# Patient Record
Sex: Male | Born: 1970 | Race: White | Hispanic: Yes | Marital: Married | Smoking: Never smoker
Health system: Southern US, Community
[De-identification: ages and names within clinical notes are randomized; demographics above are authoritative.]

---

## 2004-05-07 ENCOUNTER — Emergency Department (HOSPITAL_COMMUNITY): Admission: EM | Admit: 2004-05-07 | Discharge: 2004-05-07 | Payer: Self-pay | Admitting: Emergency Medicine

## 2004-10-24 ENCOUNTER — Emergency Department (HOSPITAL_COMMUNITY): Admission: EM | Admit: 2004-10-24 | Discharge: 2004-10-24 | Payer: Self-pay | Admitting: Emergency Medicine

## 2006-02-18 ENCOUNTER — Emergency Department (HOSPITAL_COMMUNITY): Admission: EM | Admit: 2006-02-18 | Discharge: 2006-02-18 | Payer: Self-pay | Admitting: Emergency Medicine

## 2006-07-04 IMAGING — CT CT ABDOMEN W/ CM
1 of 4 series · 14 of 32 positions shown, 19 images · IV contrast (omnipaque)
Comparison: None available.

CLINICAL DATA: 33-year-old with left-sided abdominal pain for two days.  
ABDOMEN CT WITH CONTRAST:
TECHNIQUE: Multidetector CT imaging of the abdomen was performed following the standard protocol during bolus administration of intravenous contrast.
Contrast:  125 cc Omnipaque 300 and oral contrast.
TECHNIQUE: Multidetector CT imaging of the pelvis was performed following the standard protocol during bolus administration of intravenous contrast.

[Series 2: abd_pel 5.0 b40f st · axial · 0.69mm/px · z∈[-488,-58]mm · 14 of 98 slices shown, 19 images]
[im 6/98  soft-tissue]
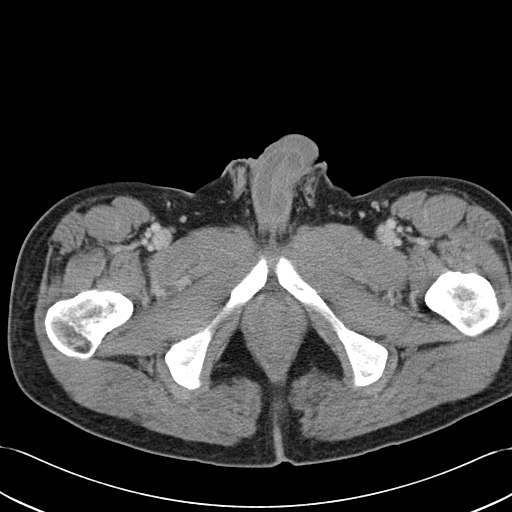
[im 6/98  bone]
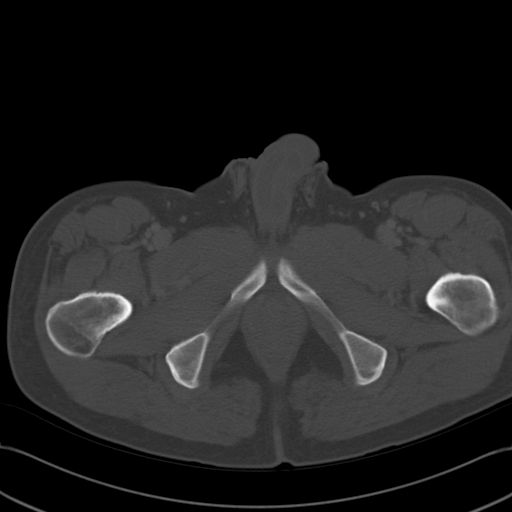
[im 12/98  soft-tissue]
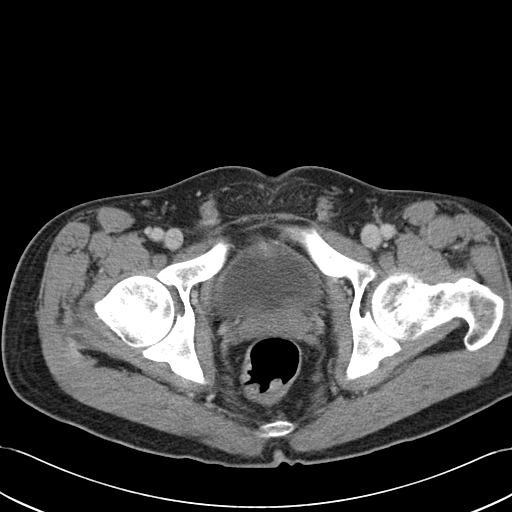
[im 23/98  soft-tissue]
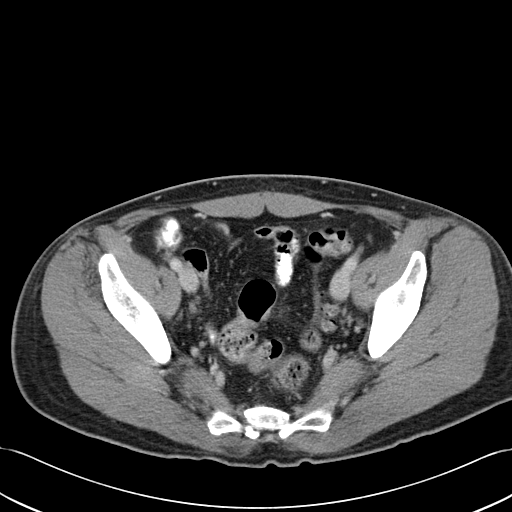
[im 29/98  soft-tissue]
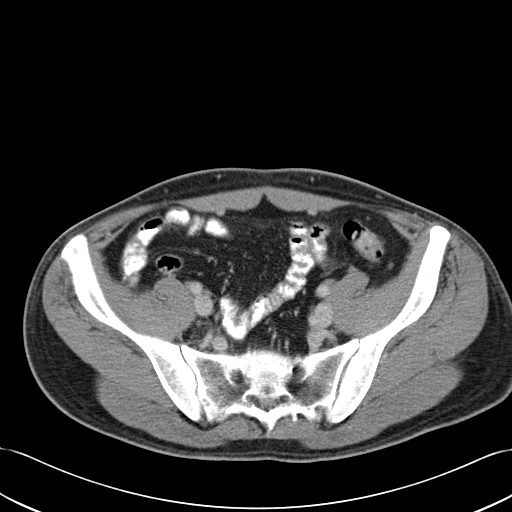
[im 35/98  soft-tissue]
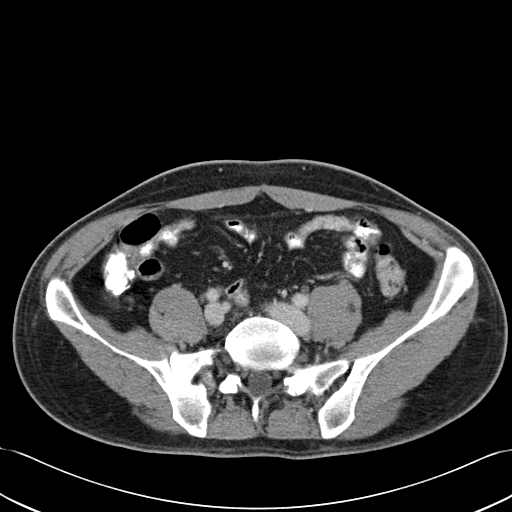
[im 40/98  soft-tissue]
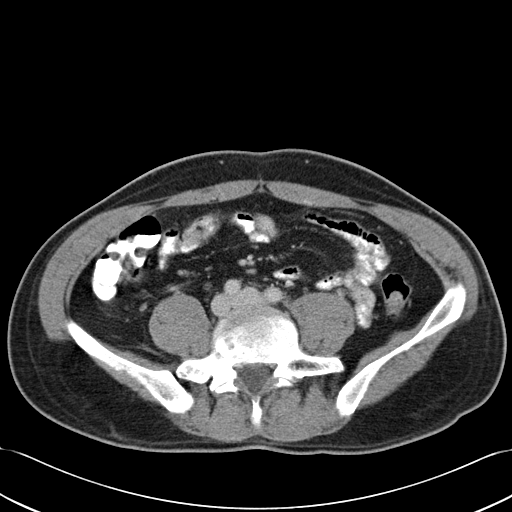
[im 52/98  soft-tissue]
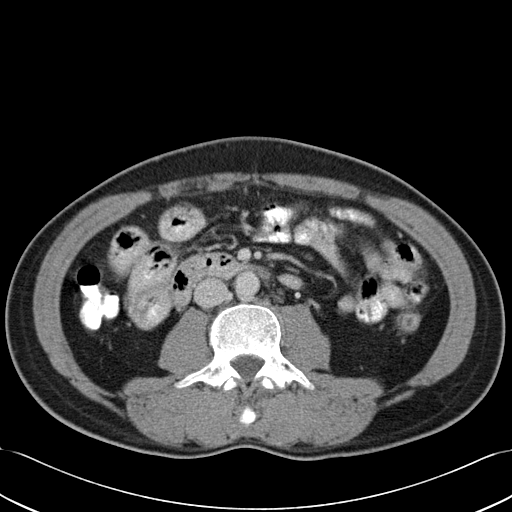
[im 58/98  soft-tissue]
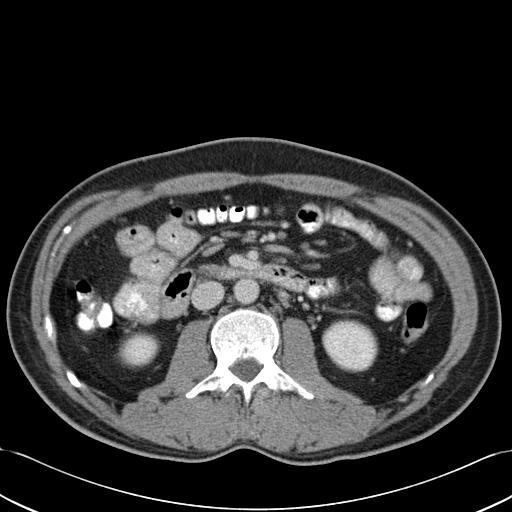
[im 63/98  soft-tissue]
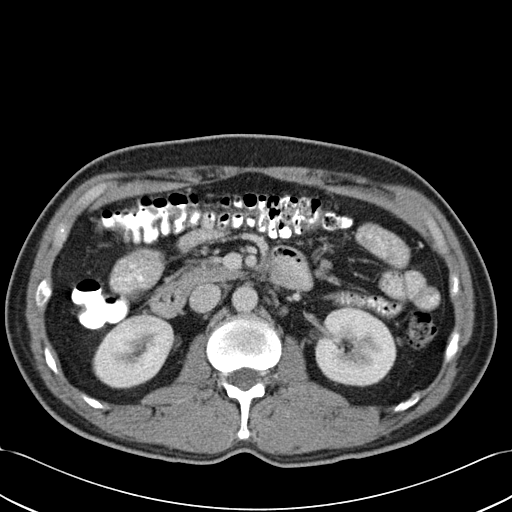
[im 63/98  bone]
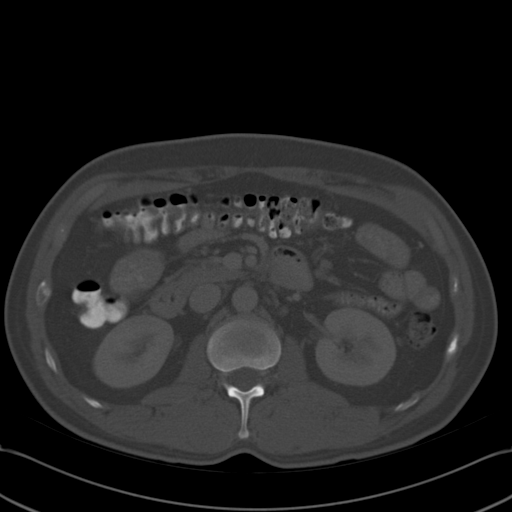
[im 69/98  soft-tissue]
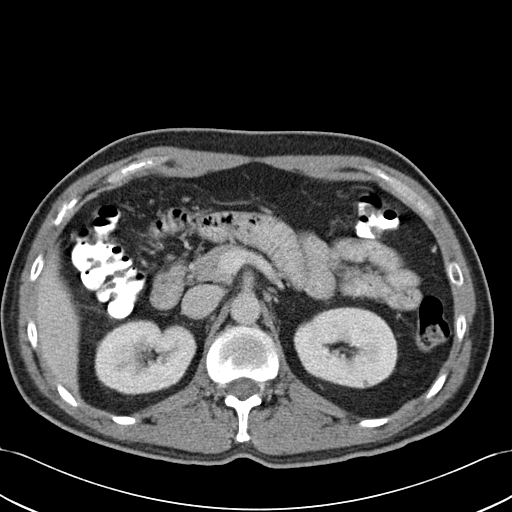
[im 75/98  soft-tissue]
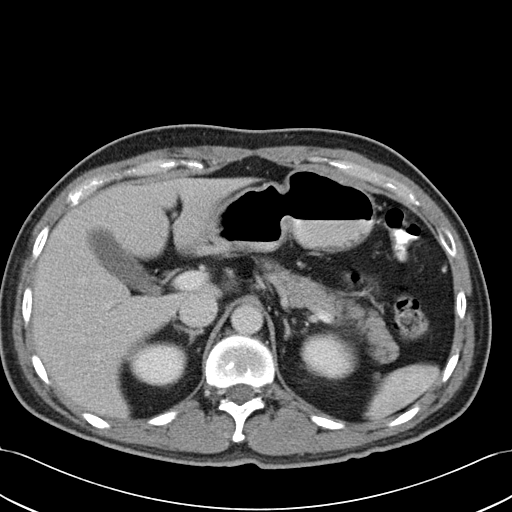
[im 75/98  lung]
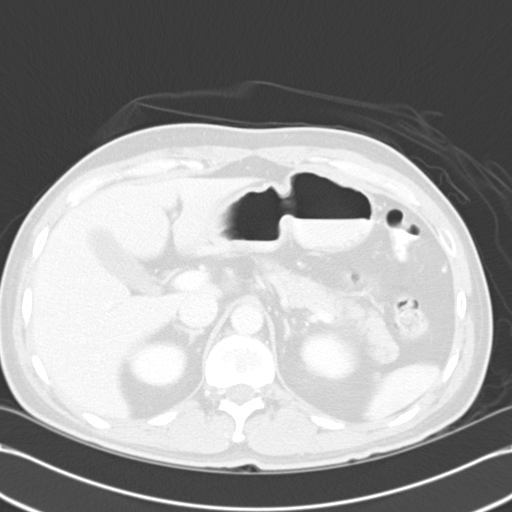
[im 80/98  lung]
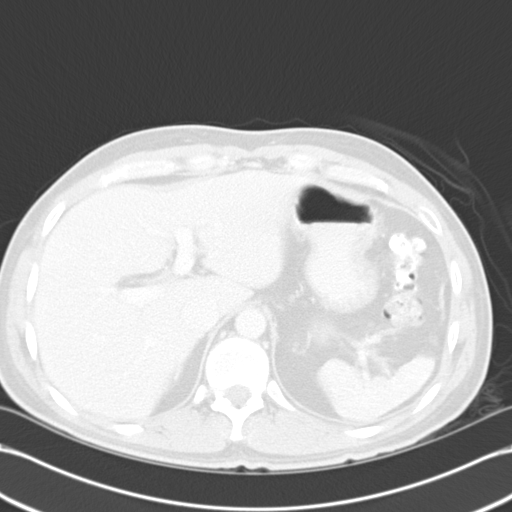
[im 86/98  soft-tissue]
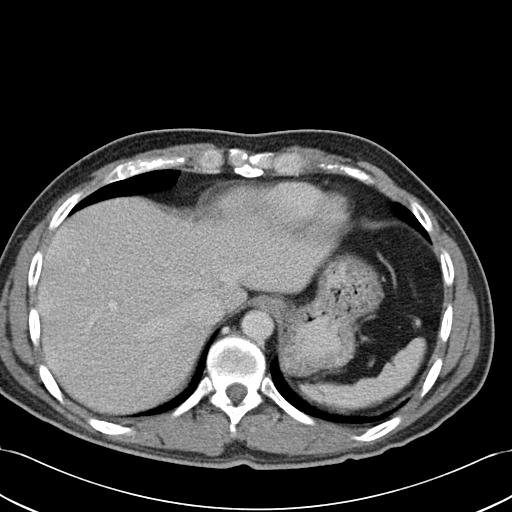
[im 86/98  lung]
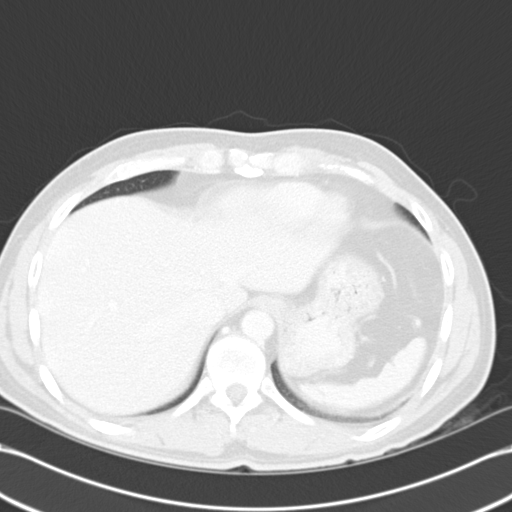
[im 92/98  soft-tissue]
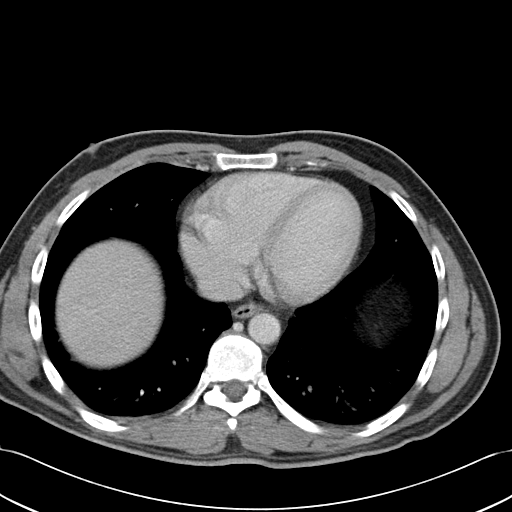
[im 92/98  lung]
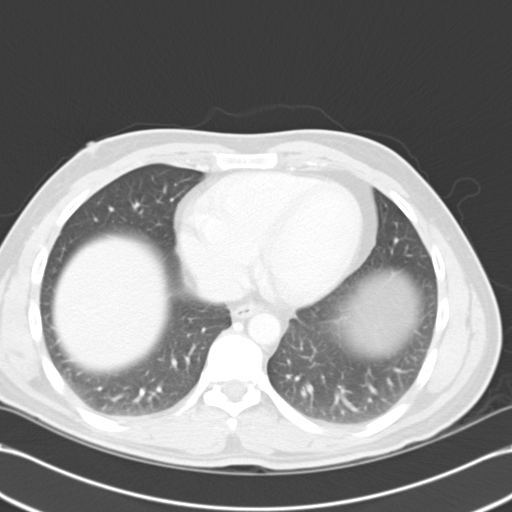

[14 of 32 positions shown; findings below may reference images not displayed]

FINDINGS: Images of the lung bases show small, nonspecific nodule in the right middle lobe measuring 5 mm in diameter.  It may be reasonable to follow-up with a CT of the chest for evaluation of other abnormalities.  No focal abnormality is seen within the liver, spleen, pancreas, adrenal glands, or kidneys.  The gallbladder is present.  There are scattered very small mesenteric lymph nodes, suggesting possible mesenteric adenitis as the source of the patient?s pain.  However, no abscess or intraperitoneal gas identified.  Normal appearing appendix.
IMPRESSION: Minimal prominent mesenteric nodes as described.  The visualized bowel loops are unremarkable.  If there is failure of symptoms to resolve, follow-up scan may be helpful to evaluate the appearance of lymph nodes. 
PELVIS CT WITH CONTRAST:
FINDINGS: There is no pelvic adenopathy or free pelvic fluid.  Pelvic bowel loops are normal in their appearance.
IMPRESSION: No evidence for acute pelvic abnormality.

## 2015-06-08 ENCOUNTER — Encounter (HOSPITAL_COMMUNITY): Payer: Self-pay | Admitting: Emergency Medicine

## 2015-06-08 ENCOUNTER — Emergency Department (HOSPITAL_COMMUNITY)
Admission: EM | Admit: 2015-06-08 | Discharge: 2015-06-08 | Disposition: A | Discharge status: Home / Self Care | Payer: Self-pay | Attending: Emergency Medicine | Admitting: Emergency Medicine

## 2015-06-08 DIAGNOSIS — B349 Viral infection, unspecified: Secondary | ICD-10-CM | POA: Insufficient documentation

## 2015-06-08 LAB — BASIC METABOLIC PANEL
Anion gap: 8 (ref 5–15)
BUN: 16 mg/dL (ref 6–20)
CALCIUM: 8.7 mg/dL — AB (ref 8.9–10.3)
CO2: 24 mmol/L (ref 22–32)
CREATININE: 0.72 mg/dL (ref 0.61–1.24)
Chloride: 108 mmol/L (ref 101–111)
GFR calc Af Amer: >60 mL/min (ref >60)
Glucose, Bld: 109 mg/dL — ABNORMAL HIGH (ref 65–99)
Potassium: 4 mmol/L (ref 3.5–5.1)
SODIUM: 140 mmol/L (ref 135–145)

## 2015-06-08 LAB — CBC WITH DIFFERENTIAL/PLATELET
Basophils Absolute: 0 10*3/uL (ref 0.0–0.1)
Basophils Relative: 1 %
EOS ABS: 0 10*3/uL (ref 0.0–0.7)
EOS PCT: 0 %
HCT: 40.1 % (ref 39.0–52.0)
Hemoglobin: 14.1 g/dL (ref 13.0–17.0)
LYMPHS ABS: 1 10*3/uL (ref 0.7–4.0)
LYMPHS PCT: 23 %
MCH: 30.3 pg (ref 26.0–34.0)
MCHC: 35.2 g/dL (ref 30.0–36.0)
MCV: 86.1 fL (ref 78.0–100.0)
MONO ABS: 0.3 10*3/uL (ref 0.1–1.0)
MONOS PCT: 7 %
Neutro Abs: 3 10*3/uL (ref 1.7–7.7)
Neutrophils Relative %: 69 %
PLATELETS: 222 10*3/uL (ref 150–400)
RBC: 4.66 MIL/uL (ref 4.22–5.81)
RDW: 13.2 % (ref 11.5–15.5)
WBC: 4.4 10*3/uL (ref 4.0–10.5)

## 2015-06-08 MED ORDER — ACETAMINOPHEN 500 MG PO TABS: 500.0000 mg | ORAL_TABLET | Freq: Four times a day (QID) | ORAL | Status: AC | PRN

## 2015-06-08 MED ORDER — IBUPROFEN 600 MG PO TABS: 600.0000 mg | ORAL_TABLET | Freq: Four times a day (QID) | ORAL | Status: AC | PRN

## 2015-06-08 MED ORDER — ACETAMINOPHEN 325 MG PO TABS
650.0000 mg | ORAL_TABLET | Freq: Once | ORAL | Status: AC
  Administered 2015-06-08: 650 mg via ORAL
  Filled 2015-06-08: qty 2

## 2015-06-08 NOTE — ED Notes (Addendum)
Patient denies cough, n/v, trouble breathing or stomach pains.    Language line used.  Joel OrleansEricka [phone]

## 2015-06-08 NOTE — ED Notes (Addendum)
Patient presents for subjective fever x1 week, minimal relief with ibuprofen and nyquil. Patient has not measured temperature. Also c/o HA and intermittent dizziness. Denies N/V/D.   Translator: Byrd HesselbachMaria [id number]

## 2015-06-08 NOTE — Discharge Instructions (Signed)
Alternate between Tylenol and ibuprofen as needed for fevers. Increase hydration- drink plenty of water over the next 2-3 days.  Please see the phone number listed for help finding a primary care provider. It is very important to follow-up with a primary physician. Return to the ER for any new or worsening symptoms, any additional concerns.

## 2015-06-08 NOTE — ED Provider Notes (Signed)
CSN: [id number]     Arrival date & time 06/08/15  1224 History  By signing my name below, I, Joel Gregory, attest that this documentation has been prepared under the direction and in the presence of Chase PicketJaime Pilcher Ward, PA-C  Electronically Signed: Iona Beardhristian Gregory, ED Scribe 06/08/2015 at 3:06 PM.   No chief complaint on file.  The history is provided by the patient. A language interpreter was used.   HPI Comments: Joel Gregory is a 45 y.o. male who presents to the Emergency Department complaining of gradual onset, intermittent, subjective fever, ongoing for 1 week. Pt reports associated chills and mild headache. Pt denies recent sick contact. He has taken ibuprofen and nyquil with minimal relief to symptoms. No other worsening or alleviating factors noted. Pt denies cough, congestion, difficulty breathing, nausea, vomiting, diarrhea, abdominal pain, change in appetite, or any other pertinent symptoms. Pt last took medicine for his fever at 5 AM. He does not currently see a PCP. Pt requested that blood work be taken for analysis.   History reviewed. No pertinent past medical history. History reviewed. No pertinent past surgical history. No family history on file. Social History  Substance Use Topics  . Smoking status: Never Smoker   . Smokeless tobacco: None  . Alcohol Use: No    Review of Systems  Constitutional: Positive for fever and chills.  HENT: Negative for congestion.   Respiratory: Negative for cough and shortness of breath.   Gastrointestinal: Negative for nausea, vomiting, abdominal pain and diarrhea.  Neurological: Positive for headaches.   Allergies  Review of patient's allergies indicates no known allergies.  Home Medications   Prior to Admission medications   Medication Sig Start Date End Date Taking? Authorizing Provider  acetaminophen (TYLENOL) 500 MG tablet Take 1 tablet (500 mg total) by mouth every 6 (six) hours as needed. 06/08/15   Chase PicketJaime Pilcher Ward, PA-C   ibuprofen (ADVIL,MOTRIN) 600 MG tablet Take 1 tablet (600 mg total) by mouth every 6 (six) hours as needed. 06/08/15   Jaime Pilcher Ward, PA-C   BP 127/76 mmHg  Pulse 103  Temp(Src) 99.8 F (37.7 C) (Oral)  Resp 18  SpO2 98% Physical Exam  Constitutional: He is oriented to person, place, and time. He appears well-developed and well-nourished.  HENT:  Head: Normocephalic and atraumatic.  OP with erythema, no exudates or tonsillar hypertrophy. Moist mucus membranes.    Neck: Normal range of motion. Neck supple.  No meningeal signs.   Cardiovascular: Regular rhythm and normal heart sounds.   Mildly tachycardic on exam.   Pulmonary/Chest: Effort normal and breath sounds normal. No respiratory distress. He has no wheezes. He has no rales.  Abdominal: Soft. He exhibits no distension. There is no tenderness.  Musculoskeletal: Normal range of motion.  Neurological: He is alert and oriented to person, place, and time.  Skin: Skin is warm and dry.  Cap refill < 3 seconds.   Psychiatric: He has a normal mood and affect.  Nursing note and vitals reviewed.   ED Course  Procedures (including critical care time) DIAGNOSTIC STUDIES: Oxygen Saturation is 98% on RA, normal by my interpretation.    COORDINATION OF CARE: 1:20 PM-Discussed treatment plan which includes tylenol for symptom management, symptom monitoring, and increased fluid consumption with pt at bedside and pt agreed to plan.   Labs Review Labs Reviewed  BASIC METABOLIC PANEL - Abnormal; Notable for the following:    Glucose, Bld 109 (*)    Calcium 8.7 (*)  All other components within normal limits  CBC WITH DIFFERENTIAL/PLATELET    Imaging Review No results found.   EKG Interpretation None      MDM   Final diagnoses:  Viral syndrome  [name] presents with febrile illness, 99.8 in triage. Patient initially tachycardic.Tylenol given and vitals rechecked - HR and temperature improved. No signs of  dehydration, tolerating PO's.  Lungs are clear. Due to patient's presentation and physical exam a chest x-ray was not ordered. Patient was given instructions to orally hydrate, rest, and use over-the-counter medications such as anti-inflammatories ibuprofen and Tylenol for fever. When giving treatment plan, patient requesting labwork for further evaluation. Given patient does not have a primary care provider, unlikely to follow up, and language barrier present (the translator used for H&P), will go ahead and obtain basic labs. CBC and BMP returned unremarkable. These labs were discussed with patient and symptomatic home care instructions were discussed once again with translator. Patient having difficulty understanding where to find OTC antipyretics, therefore prescription was given with written instructions on how to take these medications for fever. Follow up with PCP was encouraged and Tylenol to find PCP with the number on discharge instructions was explained. All questions answered.  I personally performed the services described in this documentation, which was scribed in my presence. The recorded information has been reviewed and is accurate.    [facility] Ward, PA-C 06/08/15 1513  Cathren Laine, MD 06/09/15 [phone]
